# Patient Record
Sex: Male | Born: 1955
Health system: Southern US, Community
[De-identification: ages and names within clinical notes are randomized; demographics above are authoritative.]

---

## 2003-04-06 ENCOUNTER — Ambulatory Visit (HOSPITAL_COMMUNITY): Admission: RE | Admit: 2003-04-06 | Discharge: 2003-04-06 | Payer: Self-pay | Admitting: Family Medicine

## 2003-09-01 ENCOUNTER — Ambulatory Visit (HOSPITAL_COMMUNITY): Admission: RE | Admit: 2003-09-01 | Discharge: 2003-09-01 | Payer: Self-pay | Admitting: Family Medicine

## 2008-02-04 ENCOUNTER — Encounter: Admission: RE | Admit: 2008-02-04 | Discharge: 2008-02-04 | Payer: Self-pay | Admitting: Gastroenterology

## 2008-09-07 ENCOUNTER — Encounter: Payer: Self-pay | Admitting: Internal Medicine

## 2008-10-05 ENCOUNTER — Encounter: Payer: Self-pay | Admitting: Internal Medicine

## 2008-11-16 ENCOUNTER — Encounter: Payer: Self-pay | Admitting: Internal Medicine

## 2008-11-16 ENCOUNTER — Encounter: Admission: RE | Admit: 2008-11-16 | Discharge: 2008-11-16 | Payer: Self-pay | Admitting: Family Medicine

## 2008-12-11 DIAGNOSIS — J309 Allergic rhinitis, unspecified: Secondary | ICD-10-CM | POA: Insufficient documentation

## 2008-12-11 DIAGNOSIS — I10 Essential (primary) hypertension: Secondary | ICD-10-CM | POA: Insufficient documentation

## 2008-12-11 DIAGNOSIS — E785 Hyperlipidemia, unspecified: Secondary | ICD-10-CM

## 2008-12-14 ENCOUNTER — Ambulatory Visit: Payer: Self-pay | Admitting: Internal Medicine

## 2008-12-14 DIAGNOSIS — R05 Cough: Secondary | ICD-10-CM

## 2009-01-14 ENCOUNTER — Ambulatory Visit: Payer: Self-pay | Admitting: Internal Medicine

## 2009-11-18 IMAGING — RF DG UGI W/ HIGH DENSITY W/KUB
19 of 24 series · 19 of 24 positions shown · non-contrast
Comparison: [HOSPITAL] chest x-ray report 04/06/2003.

CLINICAL DATA: Mid chest burning with burping 6 weeks.  5 weeks
Protonix medication terminated 1 week ago.

UPPER GI SERIES W/HIGH DENSITY W/KUB
TECHNIQUE: After obtaining a scout radiograph, upper GI series
performed with high density barium and effervescent agent. Thin
barium also used. Ingested 30 mm barium tablet with water.
Fluoroscopy Time: 3.1 minutes

[Series 1: run · 1 of 2 slices shown (1 of 19)]
[im 1/2]
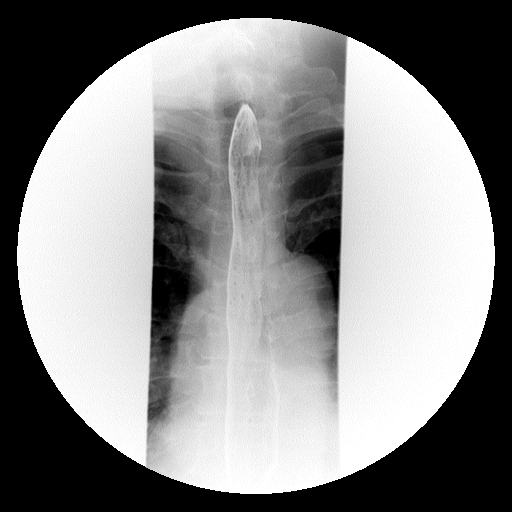

[Series 2: run · 1 of 2 slices shown (2 of 19)]
[im 1/2]
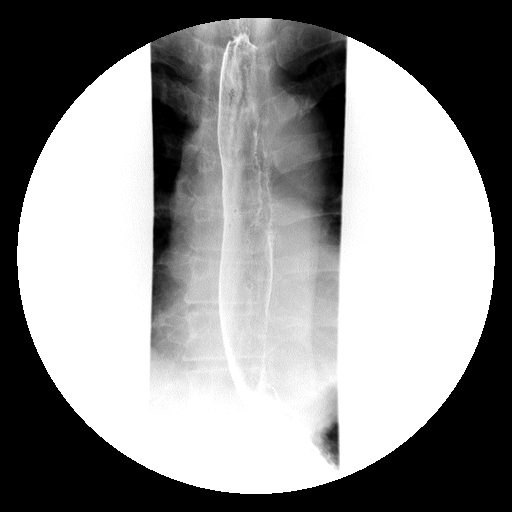

[Series 4: run · 1 of 10 slices shown (3 of 19)]
[im 1/10]
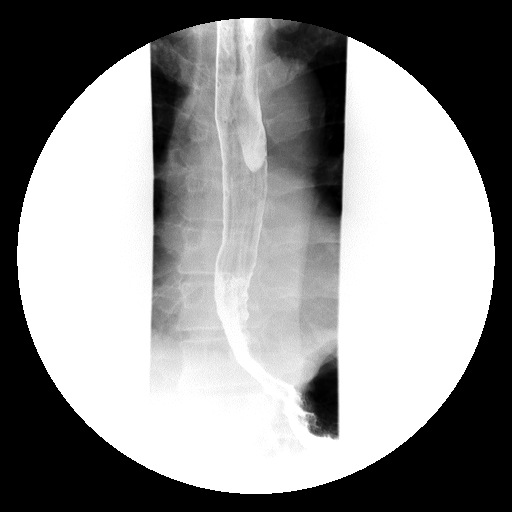

[Series 5: run · 1 of 1 slices shown (4 of 19)]
[im 1/1]
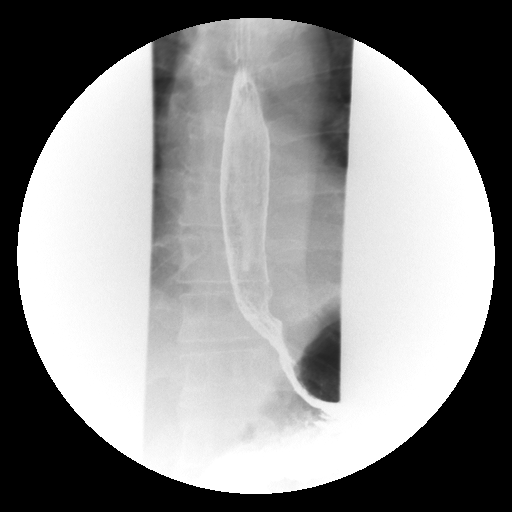

[Series 6: run · 1 of 7 slices shown (5 of 19)]
[im 1/7]
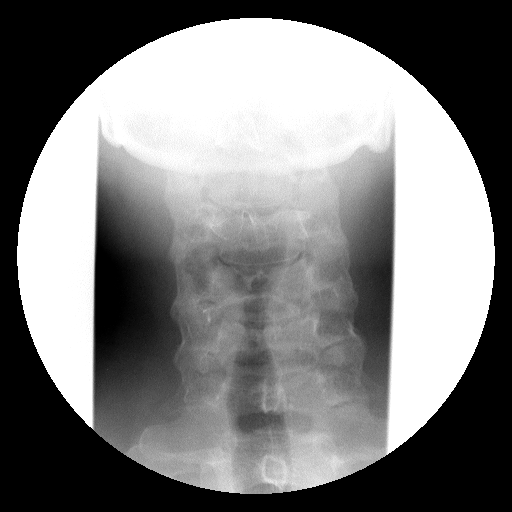

[Series 7: run · 1 of 3 slices shown (6 of 19)]
[im 1/3]
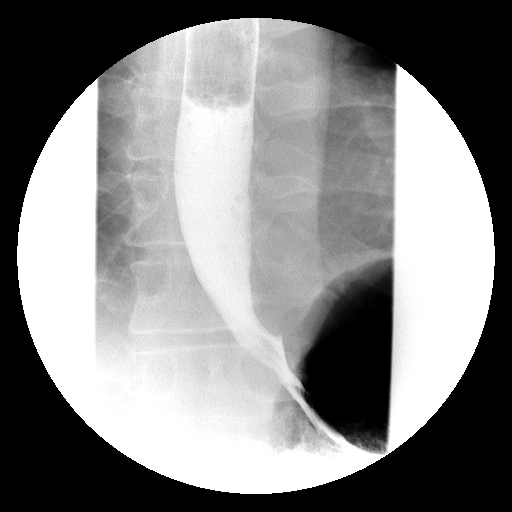

[Series 9: run · 1 of 1 slices shown (7 of 19)]
[im 1/1]
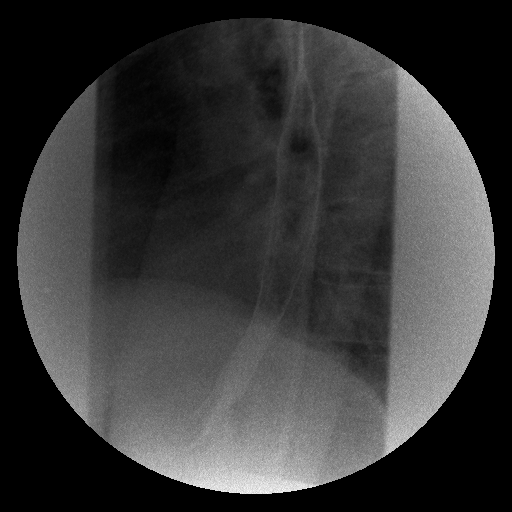

[Series 10: run · 1 of 13 slices shown (8 of 19)]
[im 1/13]
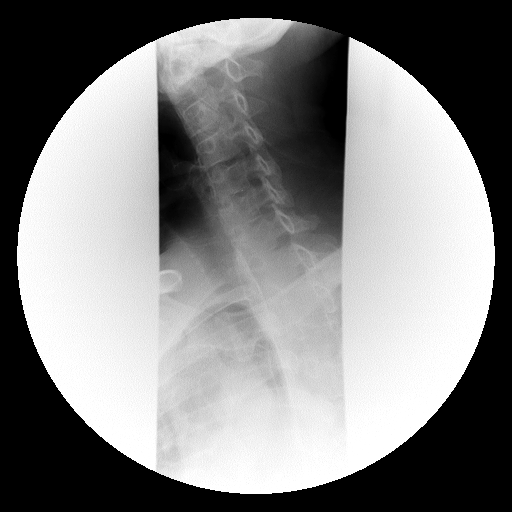

[Series 11: run · 1 of 5 slices shown (9 of 19)]
[im 1/5]
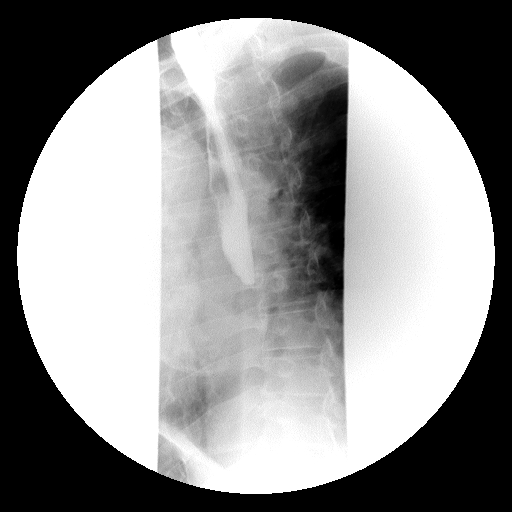

[Series 13: run · 1 of 1 slices shown (10 of 19)]
[im 1/1]
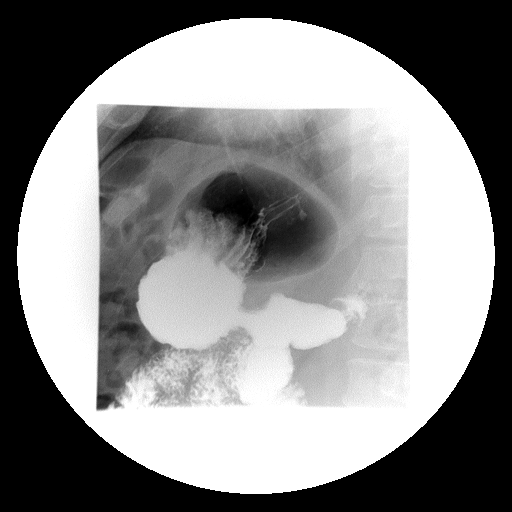

[Series 14: run · 1 of 1 slices shown (11 of 19)]
[im 1/1]
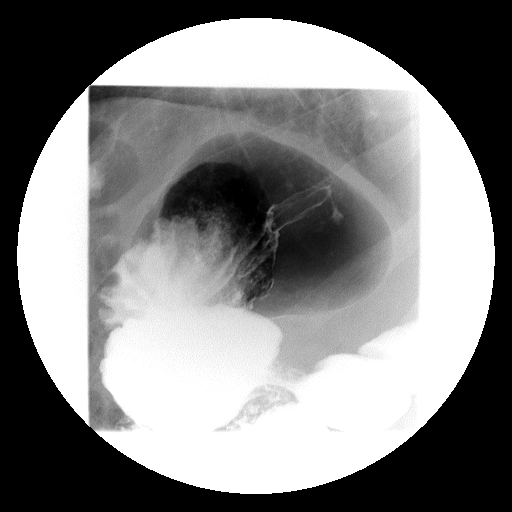

[Series 15: run · 1 of 1 slices shown (12 of 19)]
[im 1/1]
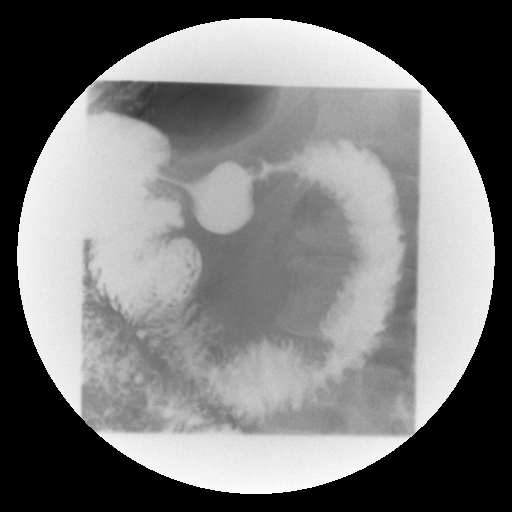

[Series 16: run · 1 of 1 slices shown (13 of 19)]
[im 1/1]
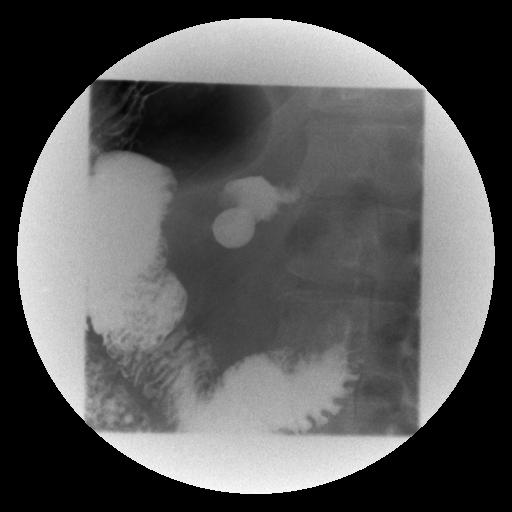

[Series 18: run · 1 of 1 slices shown (14 of 19)]
[im 1/1]
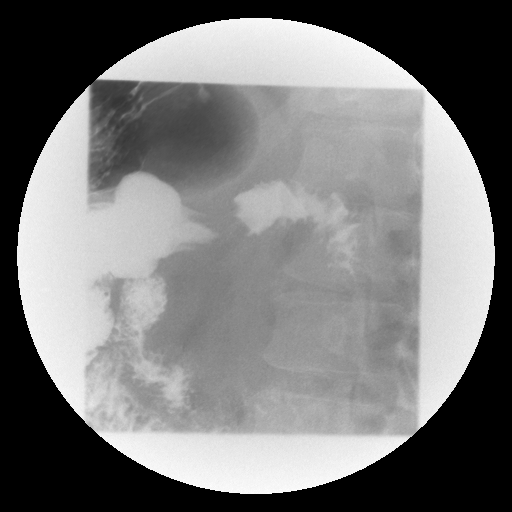

[Series 19: run · 1 of 1 slices shown (15 of 19)]
[im 1/1]
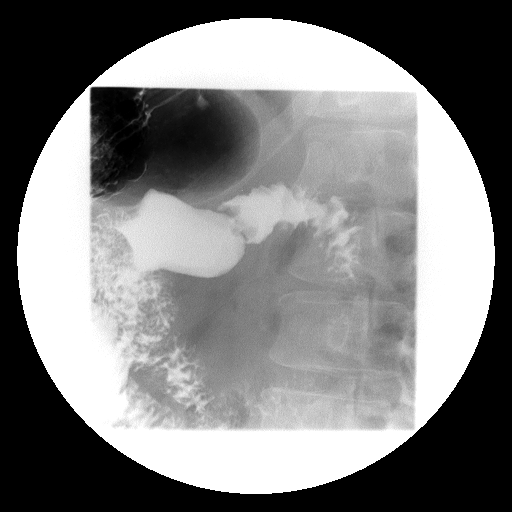

[Series 20: run · 1 of 1 slices shown (16 of 19)]
[im 1/1]
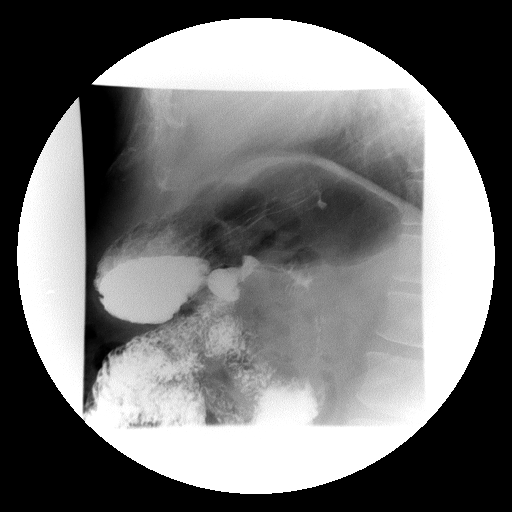

[Series 21: run · 1 of 1 slices shown (17 of 19)]
[im 1/1]
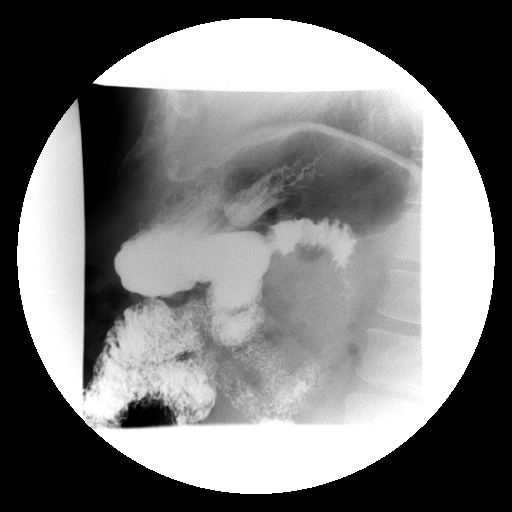

[Series 23: run · 1 of 1 slices shown (18 of 19)]
[im 1/1]
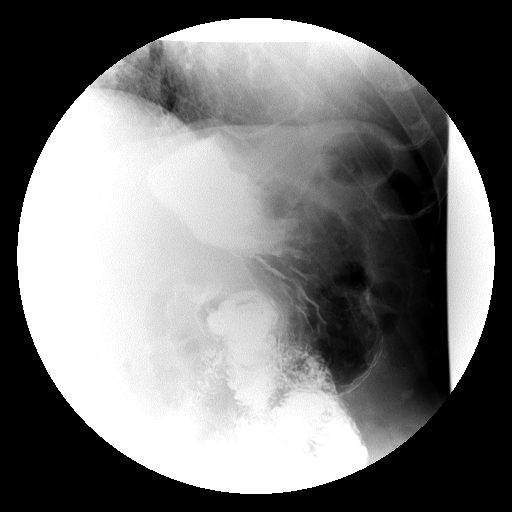

[Series 24: run · 1 of 1 slices shown (19 of 19)]
[im 1/1]
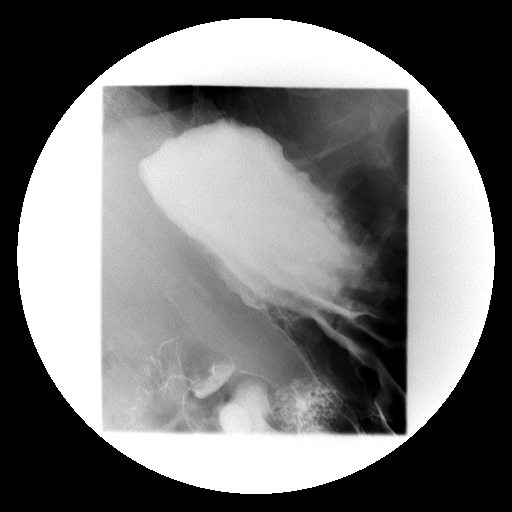

[19 of 24 positions shown; findings below may reference images not displayed]

FINDINGS: Normal antegrade peristalsis is seen through the cervical
and thoracic esophagus with no intrinsic or extrinsic esophageal
lesion identified.  No spontaneous or induced (water
siphon/Valsalva) gastroesophageal reflux was demonstrated.
Stomach, duodenum, and proximal loops of jejunum appear normal with
prompt egress of barium through structures.  Ingested 13 mm barium
tablet readily passed into the stomach.
IMPRESSION: Normal.

## 2010-08-31 IMAGING — CR DG CHEST 2V
2 series · 2 of 2 positions shown · non-contrast
Comparison: None.

CLINICAL DATA: Cough.  Chest pain and tightness.

CHEST - 2 VIEW

[view not recorded (1 of 2)]
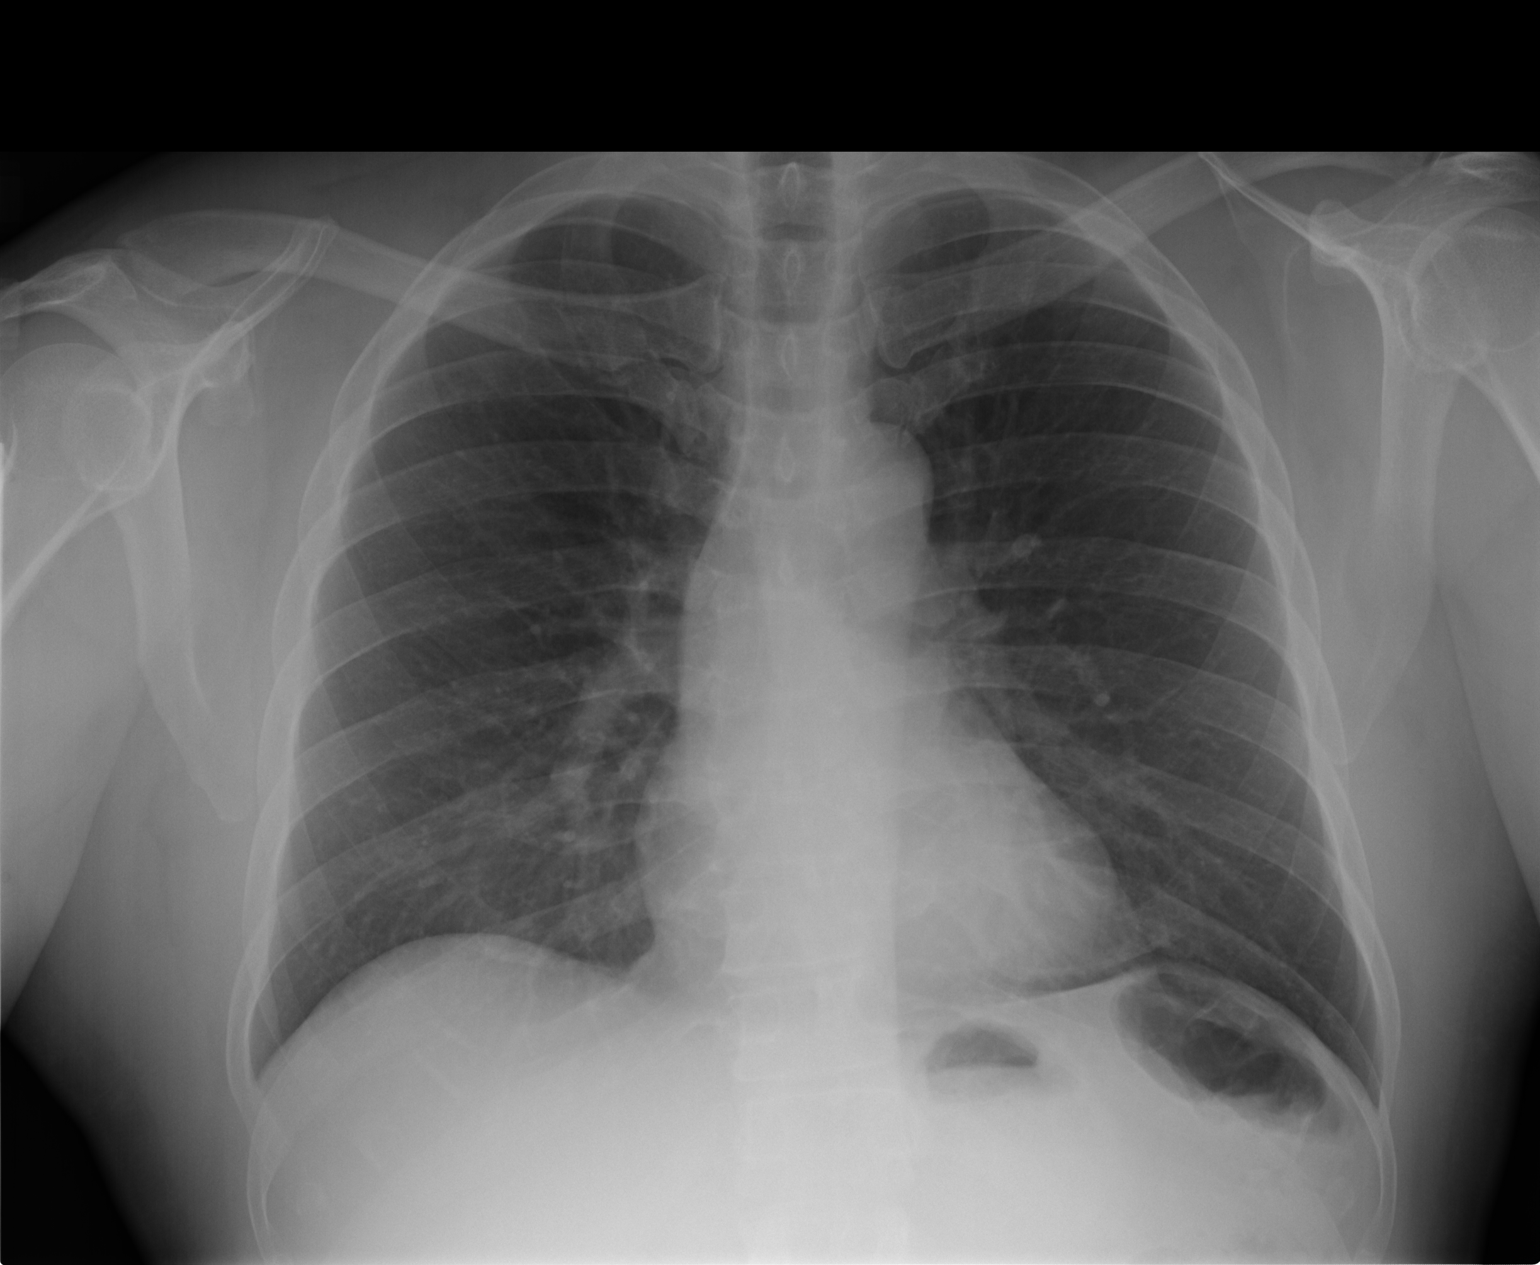

[view not recorded (2 of 2)]
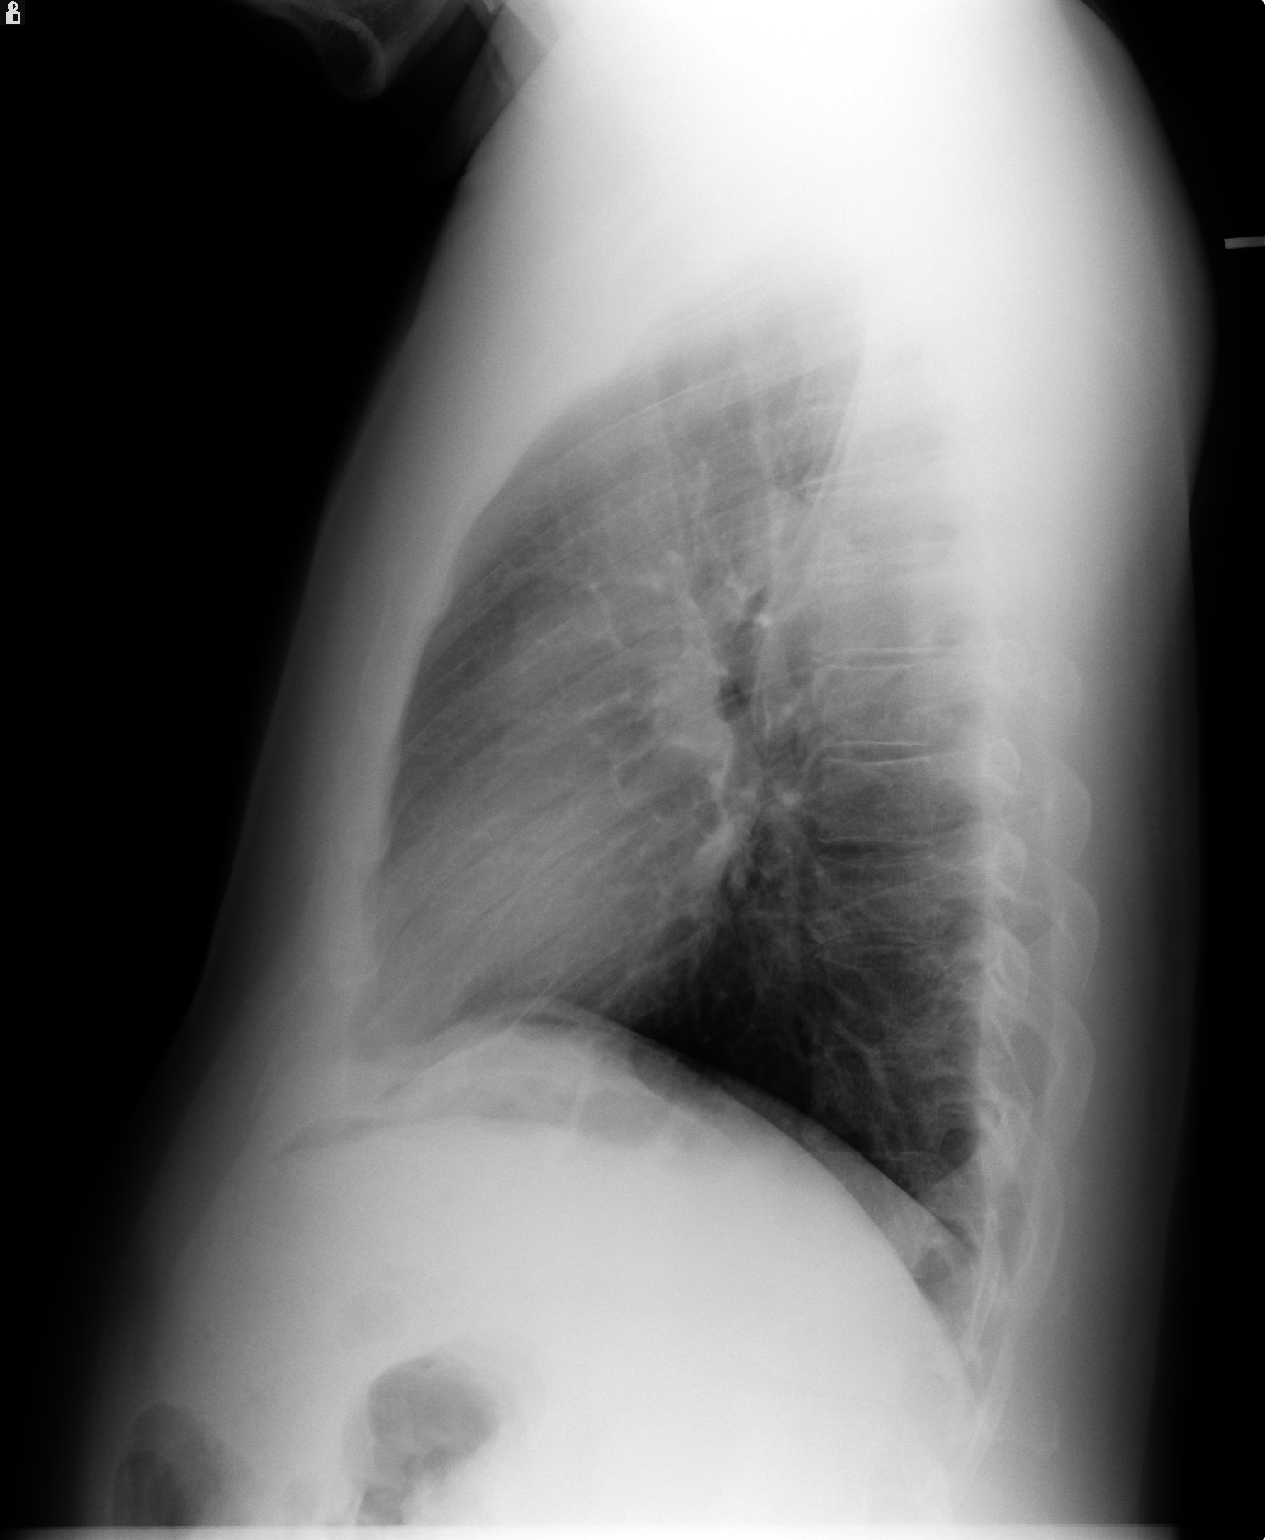

[2 of 2 positions shown; findings below may reference images not displayed]

FINDINGS: The lungs are clear without focal consolidation, edema,
effusion or pneumothorax.  Cardiopericardial silhouette is within
normal limits for size.  Imaged bony structures of the thorax are
intact.
IMPRESSION: No acute cardiopulmonary process.

## 2017-04-02 DIAGNOSIS — J209 Acute bronchitis, unspecified: Secondary | ICD-10-CM | POA: Diagnosis not present

## 2017-05-24 DIAGNOSIS — I10 Essential (primary) hypertension: Secondary | ICD-10-CM | POA: Diagnosis not present

## 2017-05-24 DIAGNOSIS — J0100 Acute maxillary sinusitis, unspecified: Secondary | ICD-10-CM | POA: Diagnosis not present

## 2017-05-24 DIAGNOSIS — M79601 Pain in right arm: Secondary | ICD-10-CM | POA: Diagnosis not present

## 2017-11-29 DIAGNOSIS — Z0000 Encounter for general adult medical examination without abnormal findings: Secondary | ICD-10-CM | POA: Diagnosis not present

## 2017-11-29 DIAGNOSIS — Z125 Encounter for screening for malignant neoplasm of prostate: Secondary | ICD-10-CM | POA: Diagnosis not present

## 2017-11-29 DIAGNOSIS — I10 Essential (primary) hypertension: Secondary | ICD-10-CM | POA: Diagnosis not present

## 2018-02-20 DIAGNOSIS — I10 Essential (primary) hypertension: Secondary | ICD-10-CM | POA: Diagnosis not present

## 2018-02-20 DIAGNOSIS — J209 Acute bronchitis, unspecified: Secondary | ICD-10-CM | POA: Diagnosis not present

## 2018-03-05 DIAGNOSIS — Z1211 Encounter for screening for malignant neoplasm of colon: Secondary | ICD-10-CM | POA: Diagnosis not present

## 2018-04-03 DIAGNOSIS — D124 Benign neoplasm of descending colon: Secondary | ICD-10-CM | POA: Diagnosis not present

## 2018-04-03 DIAGNOSIS — Z1211 Encounter for screening for malignant neoplasm of colon: Secondary | ICD-10-CM | POA: Diagnosis not present

## 2018-04-03 DIAGNOSIS — K635 Polyp of colon: Secondary | ICD-10-CM | POA: Diagnosis not present

## 2018-04-03 DIAGNOSIS — D122 Benign neoplasm of ascending colon: Secondary | ICD-10-CM | POA: Diagnosis not present

## 2018-06-03 DIAGNOSIS — J301 Allergic rhinitis due to pollen: Secondary | ICD-10-CM | POA: Diagnosis not present

## 2018-06-03 DIAGNOSIS — I10 Essential (primary) hypertension: Secondary | ICD-10-CM | POA: Diagnosis not present

## 2018-06-03 DIAGNOSIS — S86899A Other injury of other muscle(s) and tendon(s) at lower leg level, unspecified leg, initial encounter: Secondary | ICD-10-CM | POA: Diagnosis not present

## 2019-02-28 ENCOUNTER — Other Ambulatory Visit: Payer: Self-pay | Admitting: Family Medicine

## 2019-02-28 ENCOUNTER — Ambulatory Visit
Admission: RE | Admit: 2019-02-28 | Discharge: 2019-02-28 | Disposition: A | Discharge status: Home / Self Care | Payer: 59 | Source: Ambulatory Visit | Attending: Family Medicine | Admitting: Family Medicine

## 2019-02-28 ENCOUNTER — Other Ambulatory Visit: Payer: Self-pay

## 2019-02-28 DIAGNOSIS — R0789 Other chest pain: Secondary | ICD-10-CM

## 2019-06-07 ENCOUNTER — Ambulatory Visit: Payer: Self-pay | Attending: Internal Medicine

## 2019-06-07 DIAGNOSIS — Z23 Encounter for immunization: Secondary | ICD-10-CM

## 2019-06-07 NOTE — Progress Notes (Signed)
   Covid-19 Vaccination Clinic  Name:  Micajah Oas    MRN: [id number] DOB: October 08, 1955  06/07/2019  Mr. Oakley was observed post Covid-19 immunization for 15 minutes without incident. He was provided with Vaccine Information Sheet and instruction to access the V-Safe system.   Mr. Bergschneider was instructed to call 911 with any severe reactions post vaccine: Marland Kitchen Difficulty breathing  . Swelling of face and throat  . A fast heartbeat  . A bad rash all over body  . Dizziness and weakness   Immunizations Administered    Name Date Dose VIS Date Route   Pfizer COVID-19 Vaccine 06/07/2019  1:13 PM 0.3 mL 03/07/2019 Intramuscular   Manufacturer: [place]   Lot: [id number]   [place]: [id number]

## 2019-07-01 ENCOUNTER — Ambulatory Visit: Payer: Self-pay | Attending: Internal Medicine

## 2019-07-01 DIAGNOSIS — Z23 Encounter for immunization: Secondary | ICD-10-CM

## 2019-07-01 NOTE — Progress Notes (Signed)
   Covid-19 Vaccination Clinic  Name:  Charles Walton    MRN: [id number] DOB: 03-May-1955  07/01/2019  [name] was observed post Covid-19 immunization for 15 minutes without incident. He was provided with Vaccine Information Sheet and instruction to access the V-Safe system.   [name] was instructed to call 911 with any severe reactions post vaccine: Marland Kitchen Difficulty breathing  . Swelling of face and throat  . A fast heartbeat  . A bad rash all over body  . Dizziness and weakness   Immunizations Administered    Name Date Dose VIS Date Route   Pfizer COVID-19 Vaccine 07/01/2019 12:31 PM 0.3 mL 03/07/2019 Intramuscular   Manufacturer: [place]   Lot: [id number]   [place]: [id number]

## 2019-07-21 DIAGNOSIS — R229 Localized swelling, mass and lump, unspecified: Secondary | ICD-10-CM | POA: Diagnosis not present

## 2019-07-21 DIAGNOSIS — L739 Follicular disorder, unspecified: Secondary | ICD-10-CM | POA: Diagnosis not present

## 2019-08-21 ENCOUNTER — Other Ambulatory Visit: Payer: Self-pay | Admitting: Family Medicine

## 2019-08-21 DIAGNOSIS — R229 Localized swelling, mass and lump, unspecified: Secondary | ICD-10-CM

## 2019-08-28 ENCOUNTER — Ambulatory Visit
Admission: RE | Admit: 2019-08-28 | Discharge: 2019-08-28 | Disposition: A | Discharge status: Home / Self Care | Payer: BC Managed Care – PPO | Source: Ambulatory Visit | Attending: Family Medicine | Admitting: Family Medicine

## 2019-08-28 DIAGNOSIS — R2231 Localized swelling, mass and lump, right upper limb: Secondary | ICD-10-CM | POA: Diagnosis not present

## 2019-08-28 DIAGNOSIS — R229 Localized swelling, mass and lump, unspecified: Secondary | ICD-10-CM

## 2020-02-27 DIAGNOSIS — J0100 Acute maxillary sinusitis, unspecified: Secondary | ICD-10-CM | POA: Diagnosis not present

## 2020-06-04 ENCOUNTER — Ambulatory Visit: Payer: BC Managed Care – PPO

## 2020-06-07 ENCOUNTER — Ambulatory Visit: Payer: BC Managed Care – PPO | Attending: Internal Medicine

## 2020-06-07 DIAGNOSIS — Z23 Encounter for immunization: Secondary | ICD-10-CM

## 2020-06-08 ENCOUNTER — Other Ambulatory Visit (HOSPITAL_COMMUNITY): Payer: Self-pay | Admitting: Internal Medicine

## 2020-07-26 DIAGNOSIS — M545 Low back pain, unspecified: Secondary | ICD-10-CM | POA: Diagnosis not present

## 2020-12-29 DIAGNOSIS — U071 COVID-19: Secondary | ICD-10-CM | POA: Diagnosis not present

## 2021-01-07 DIAGNOSIS — I10 Essential (primary) hypertension: Secondary | ICD-10-CM | POA: Diagnosis not present

## 2021-01-07 DIAGNOSIS — Z0000 Encounter for general adult medical examination without abnormal findings: Secondary | ICD-10-CM | POA: Diagnosis not present

## 2021-01-07 DIAGNOSIS — Z125 Encounter for screening for malignant neoplasm of prostate: Secondary | ICD-10-CM | POA: Diagnosis not present

## 2021-06-11 IMAGING — US US EXTREM UP *R* LTD
1 series · 14 of 18 positions shown · non-contrast
Comparison: None.

CLINICAL DATA: Right forearm mass.

EXAM:
ULTRASOUND RIGHT UPPER EXTREMITY LIMITED
TECHNIQUE: Ultrasound examination of the upper extremity soft tissues was
performed in the area of clinical concern.

[Series 1: us extrem up *right* ltd · 0.06mm/px · 14 of 18 slices shown]
[im 1/18]
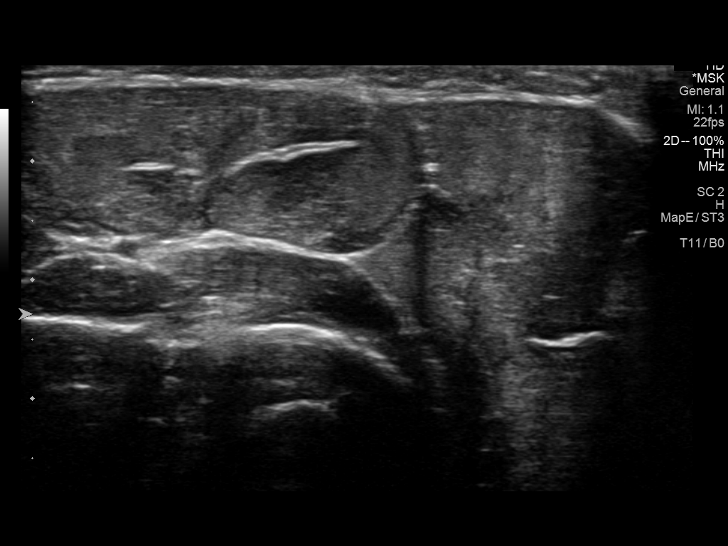
[im 2/18]
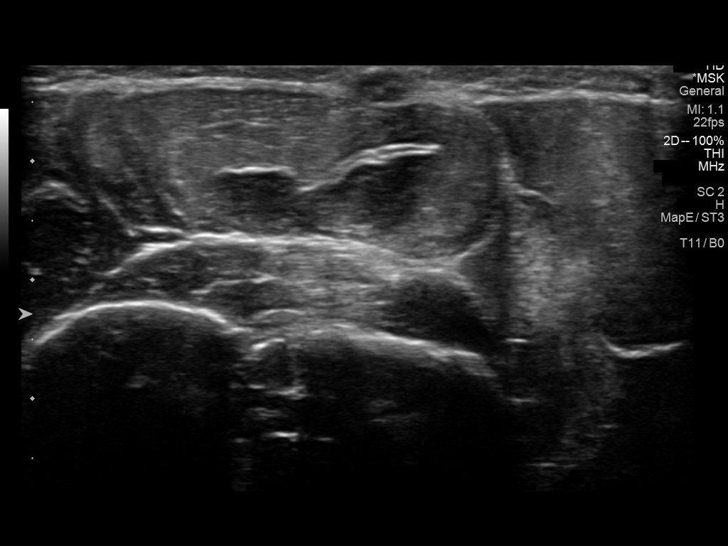
[im 4/18]
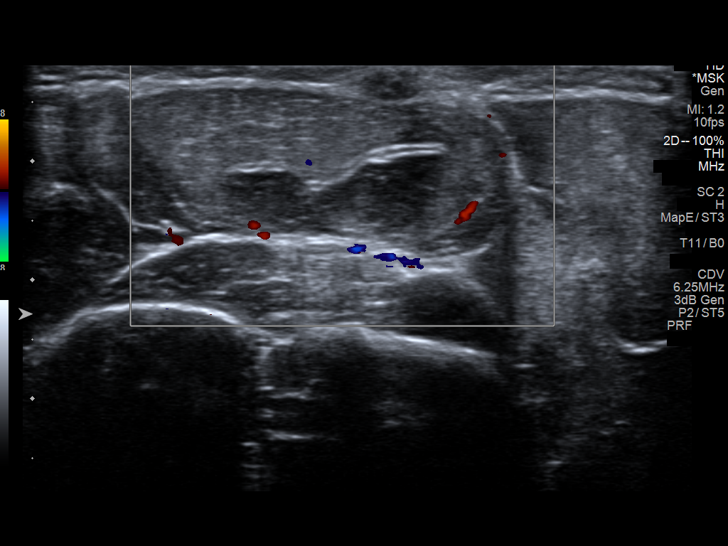
[im 5/18]
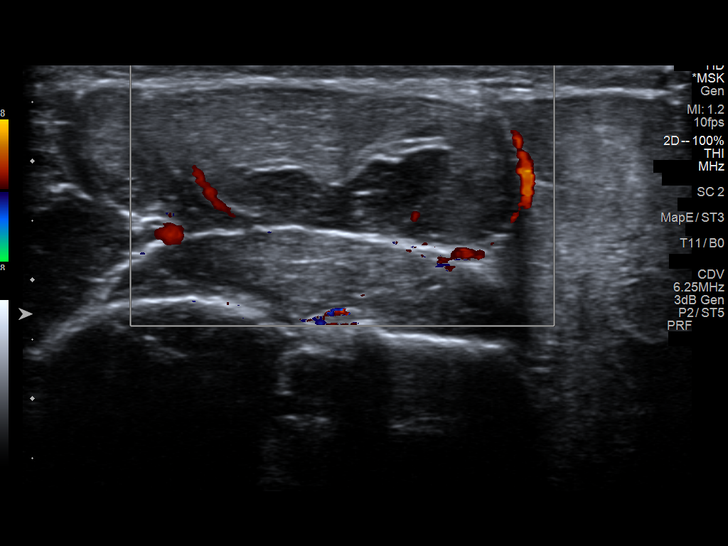
[im 6/18]
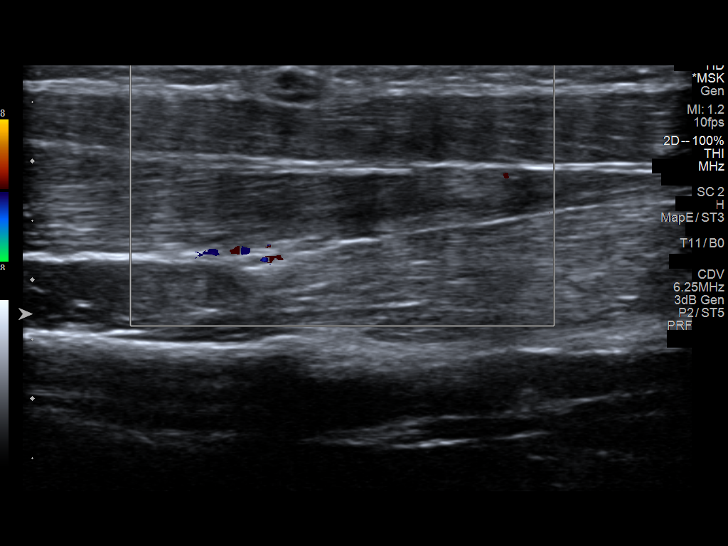
[im 8/18]
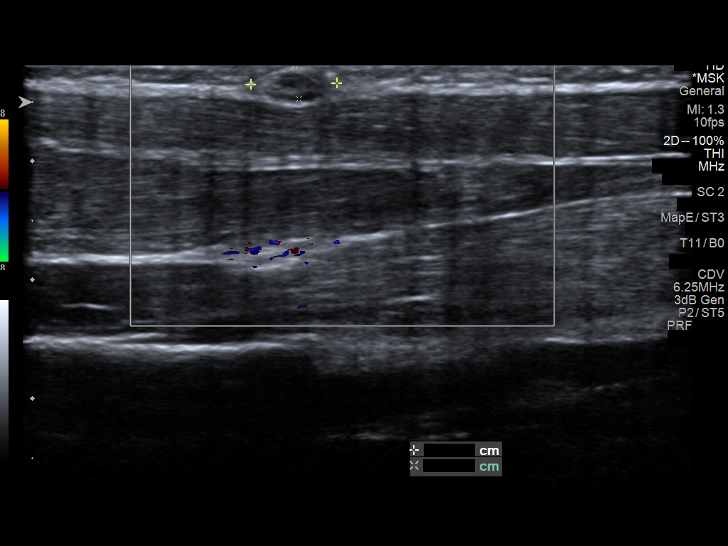
[im 9/18]
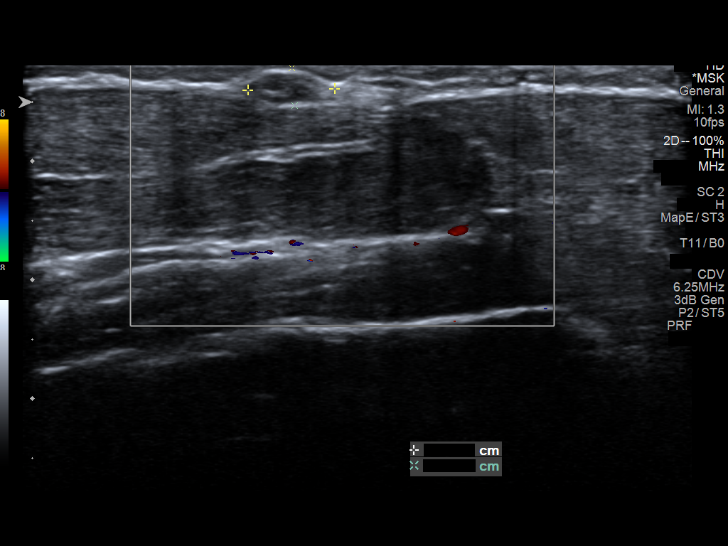
[im 10/18]
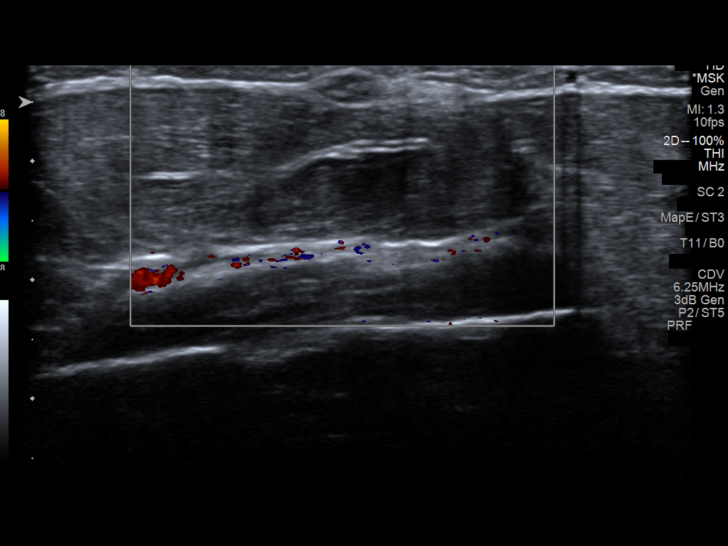
[im 11/18]
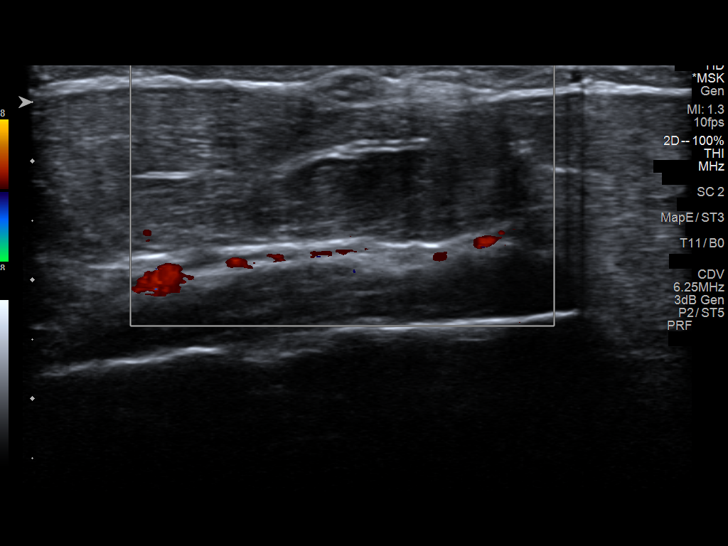
[im 13/18]
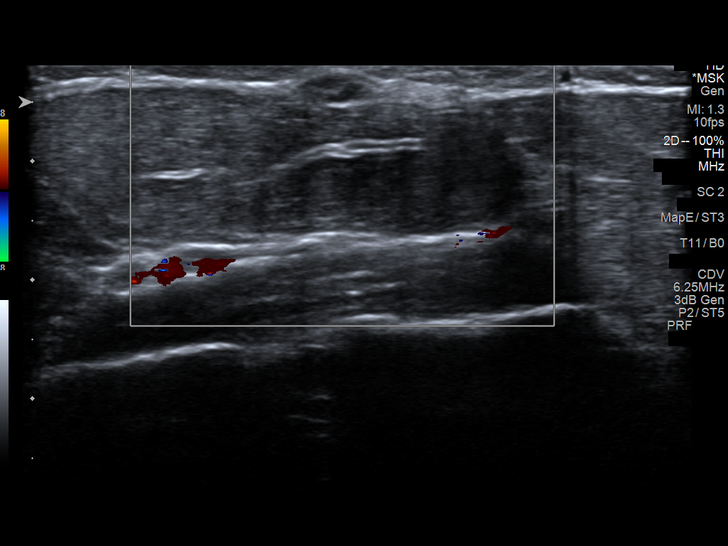
[im 14/18]
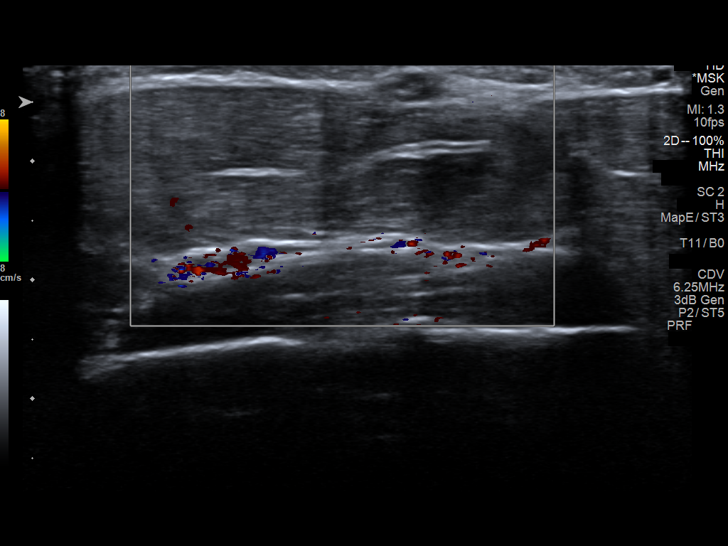
[im 15/18]
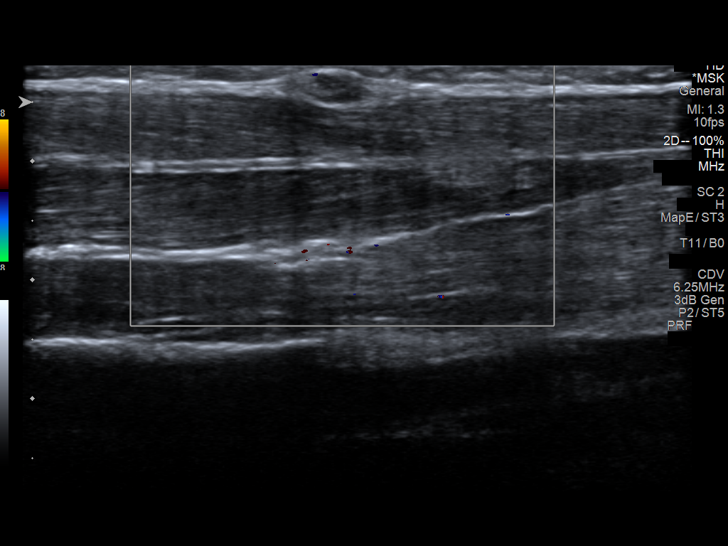
[im 17/18]
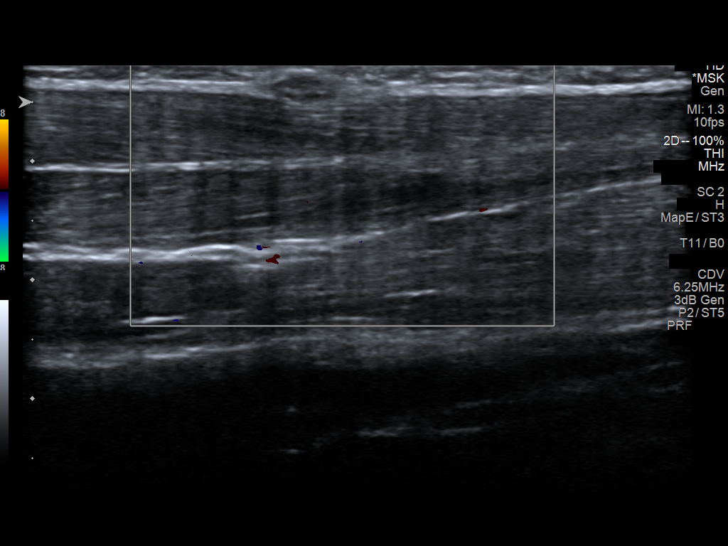
[im 18/18]
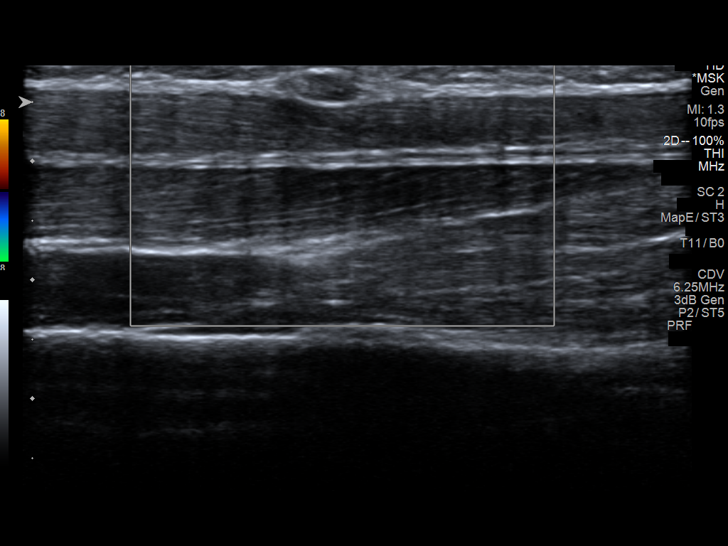

[14 of 18 positions shown; findings below may reference images not displayed]

FINDINGS: Focused ultrasound in the area of clinical concern along the dorsal
and ulnar aspect of the right mid forearm demonstrates a 7 x 3 x 7
mm well-defined oval mixed hyper and hypoechoic lesion without
internal vascularity in the subcutaneous fat adjacent to the
superficial fascia.
IMPRESSION: Palpable abnormality corresponds to a superficial 7 mm complex
lesion without internal vascularity, likely a small epidermoid cyst.

## 2021-07-15 DIAGNOSIS — J309 Allergic rhinitis, unspecified: Secondary | ICD-10-CM | POA: Diagnosis not present

## 2021-07-15 DIAGNOSIS — R739 Hyperglycemia, unspecified: Secondary | ICD-10-CM | POA: Diagnosis not present

## 2021-07-15 DIAGNOSIS — I10 Essential (primary) hypertension: Secondary | ICD-10-CM | POA: Diagnosis not present

## 2022-01-26 DIAGNOSIS — I10 Essential (primary) hypertension: Secondary | ICD-10-CM | POA: Diagnosis not present

## 2022-01-26 DIAGNOSIS — Z0000 Encounter for general adult medical examination without abnormal findings: Secondary | ICD-10-CM | POA: Diagnosis not present

## 2022-01-26 DIAGNOSIS — E119 Type 2 diabetes mellitus without complications: Secondary | ICD-10-CM | POA: Diagnosis not present

## 2022-09-05 DIAGNOSIS — H938X1 Other specified disorders of right ear: Secondary | ICD-10-CM | POA: Diagnosis not present

## 2022-09-05 DIAGNOSIS — J0100 Acute maxillary sinusitis, unspecified: Secondary | ICD-10-CM | POA: Diagnosis not present

## 2022-09-12 DIAGNOSIS — J309 Allergic rhinitis, unspecified: Secondary | ICD-10-CM | POA: Diagnosis not present

## 2022-09-12 DIAGNOSIS — E119 Type 2 diabetes mellitus without complications: Secondary | ICD-10-CM | POA: Diagnosis not present

## 2022-09-12 DIAGNOSIS — I10 Essential (primary) hypertension: Secondary | ICD-10-CM | POA: Diagnosis not present

## 2023-02-13 DIAGNOSIS — Z23 Encounter for immunization: Secondary | ICD-10-CM | POA: Diagnosis not present

## 2023-02-13 DIAGNOSIS — Z125 Encounter for screening for malignant neoplasm of prostate: Secondary | ICD-10-CM | POA: Diagnosis not present

## 2023-02-13 DIAGNOSIS — Z0000 Encounter for general adult medical examination without abnormal findings: Secondary | ICD-10-CM | POA: Diagnosis not present

## 2023-02-13 DIAGNOSIS — E119 Type 2 diabetes mellitus without complications: Secondary | ICD-10-CM | POA: Diagnosis not present

## 2023-02-13 DIAGNOSIS — I10 Essential (primary) hypertension: Secondary | ICD-10-CM | POA: Diagnosis not present

## 2023-08-07 DIAGNOSIS — E119 Type 2 diabetes mellitus without complications: Secondary | ICD-10-CM | POA: Diagnosis not present

## 2023-08-07 DIAGNOSIS — I10 Essential (primary) hypertension: Secondary | ICD-10-CM | POA: Diagnosis not present

## 2023-08-07 DIAGNOSIS — J301 Allergic rhinitis due to pollen: Secondary | ICD-10-CM | POA: Diagnosis not present

## 2023-08-07 DIAGNOSIS — J0110 Acute frontal sinusitis, unspecified: Secondary | ICD-10-CM | POA: Diagnosis not present

## 2023-10-26 DIAGNOSIS — J0111 Acute recurrent frontal sinusitis: Secondary | ICD-10-CM | POA: Diagnosis not present

## 2023-10-26 DIAGNOSIS — M549 Dorsalgia, unspecified: Secondary | ICD-10-CM | POA: Diagnosis not present

## 2023-10-26 DIAGNOSIS — I10 Essential (primary) hypertension: Secondary | ICD-10-CM | POA: Diagnosis not present

## 2024-03-14 DIAGNOSIS — Z0000 Encounter for general adult medical examination without abnormal findings: Secondary | ICD-10-CM | POA: Diagnosis not present

## 2024-03-14 DIAGNOSIS — E119 Type 2 diabetes mellitus without complications: Secondary | ICD-10-CM | POA: Diagnosis not present

## 2024-03-14 DIAGNOSIS — I10 Essential (primary) hypertension: Secondary | ICD-10-CM | POA: Diagnosis not present

## 2024-03-14 DIAGNOSIS — Z125 Encounter for screening for malignant neoplasm of prostate: Secondary | ICD-10-CM | POA: Diagnosis not present

## 2024-03-14 DIAGNOSIS — J301 Allergic rhinitis due to pollen: Secondary | ICD-10-CM | POA: Diagnosis not present
